# Patient Record
Sex: Male | Born: 1955 | Race: White | Hispanic: No | Marital: Married | Smoking: Current some day smoker
Health system: Southern US, Community
[De-identification: ages and names within clinical notes are randomized; demographics above are authoritative.]

## PROBLEM LIST (undated history)

## (undated) DIAGNOSIS — I10 Essential (primary) hypertension: Secondary | ICD-10-CM

---

## 2011-03-30 ENCOUNTER — Ambulatory Visit: Payer: Self-pay | Admitting: Gastroenterology

## 2011-04-17 ENCOUNTER — Ambulatory Visit: Payer: Self-pay | Admitting: Gastroenterology

## 2012-03-09 IMAGING — US ULTRASOUND CORE BIOPSY
1 series · 5 of 5 positions shown · non-contrast
Comparison: none

REASON FOR EXAM: hep C
COMMENTS:

PROCEDURE:     US  - US GUIDED BX/ASPIRATION NOT BR  - April 17, 2011  [DATE]
RESULT:
HISTORY: Hepatitis C.

[Series 1: ultrasound core biopsy · 5 of 5 slices shown]
[im 1/5]
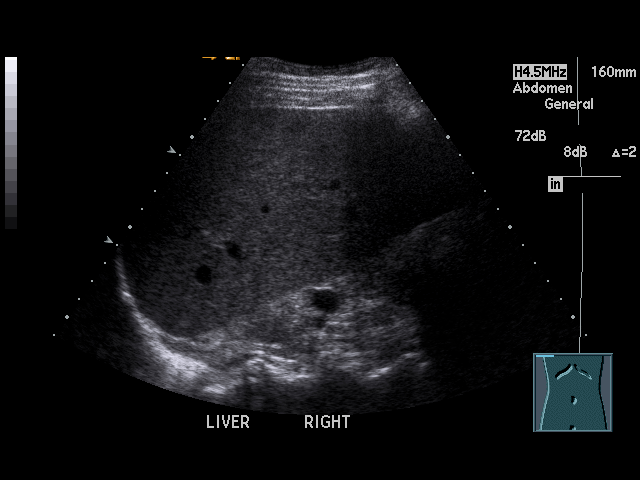
[im 2/5]
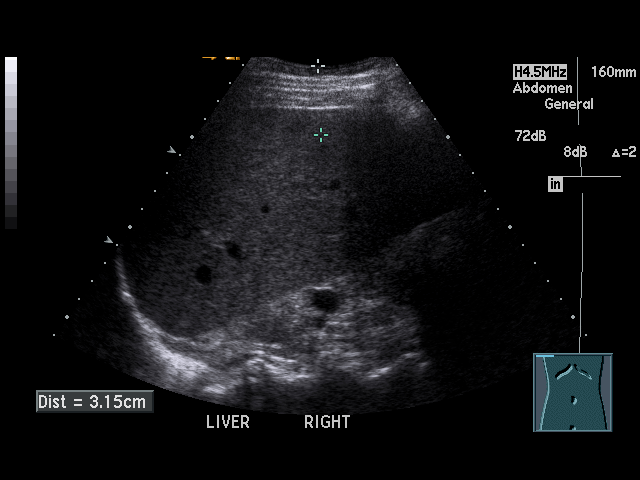
[im 3/5]
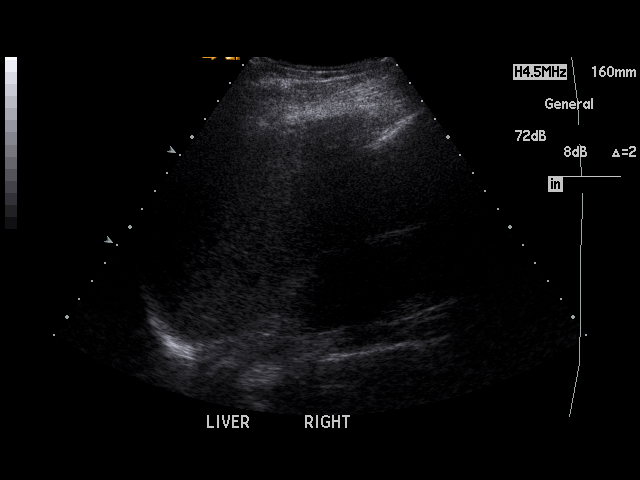
[im 4/5]
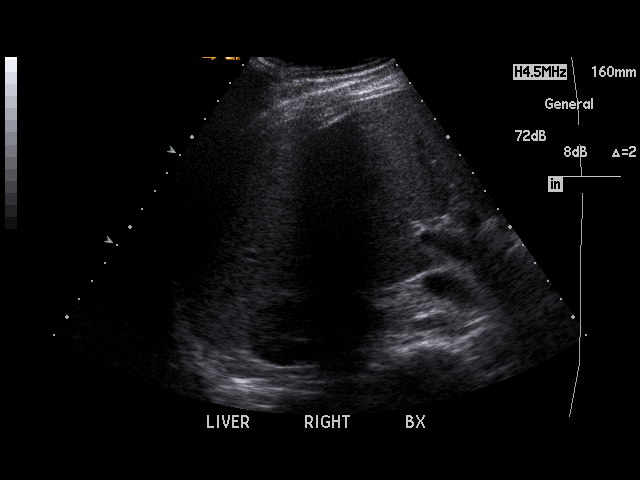
[im 5/5]
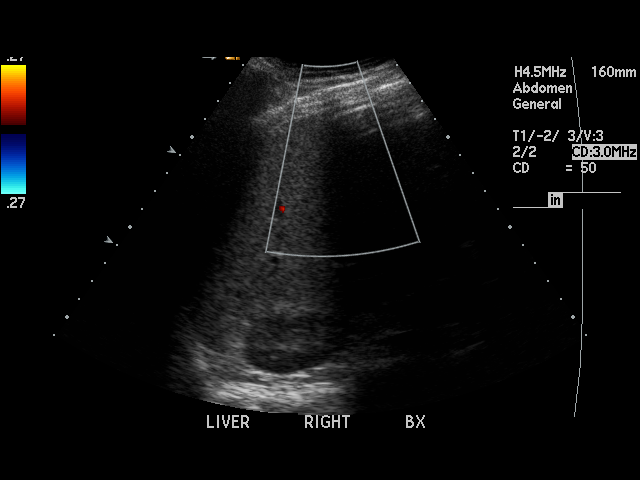

[5 of 5 positions shown; findings below may reference images not displayed]

FINDINGS: After discussing the risks and benefits of this procedure, patient
informed consent was obtained. The abdomen was sterilely prepped and draped.
Following local anesthesia with 1% Lidocaine, IV conscious sedation was
performed and an 18 gauge Achieve needle system was advanced into the liver
and a core sample obtained. There were no complications.
IMPRESSION: Successful ultrasound directed liver biopsy.

## 2014-05-05 ENCOUNTER — Emergency Department: Payer: Self-pay | Admitting: Emergency Medicine

## 2015-03-28 IMAGING — CR LEFT WRIST - COMPLETE 3+ VIEW
1 series · 4 of 4 positions shown · non-contrast
Comparison: None.

CLINICAL DATA: Radial wrist pain status post fall.

EXAM:
LEFT WRIST - COMPLETE 3+ VIEW

[Series 1: pa · 0.17mm/px · 4 of 4 slices shown]
[im 1/4]
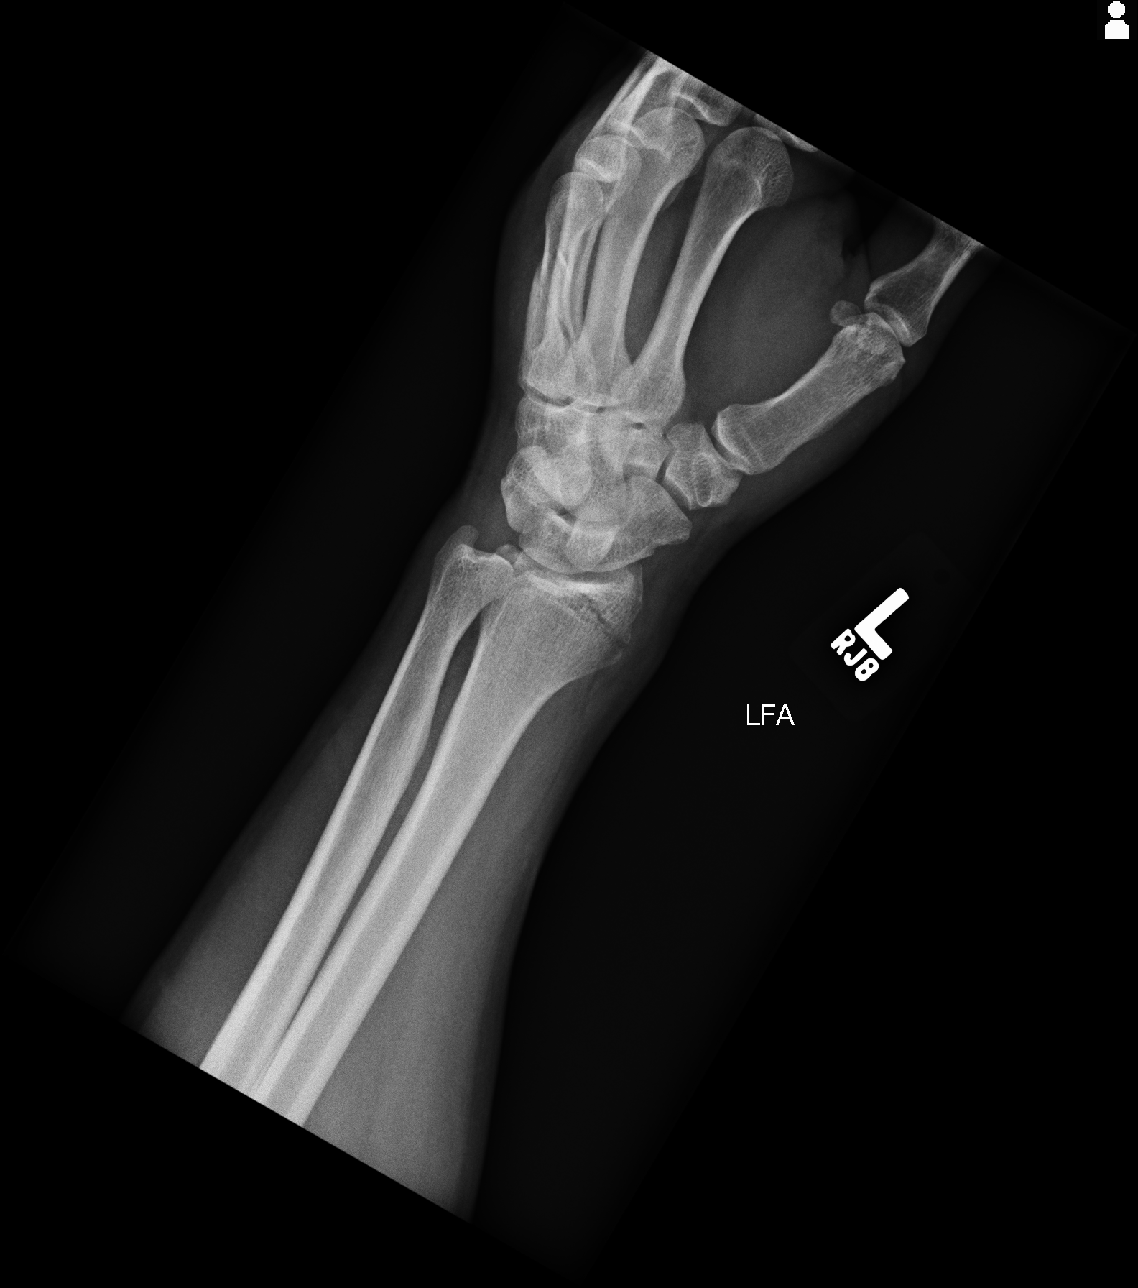
[im 2/4]
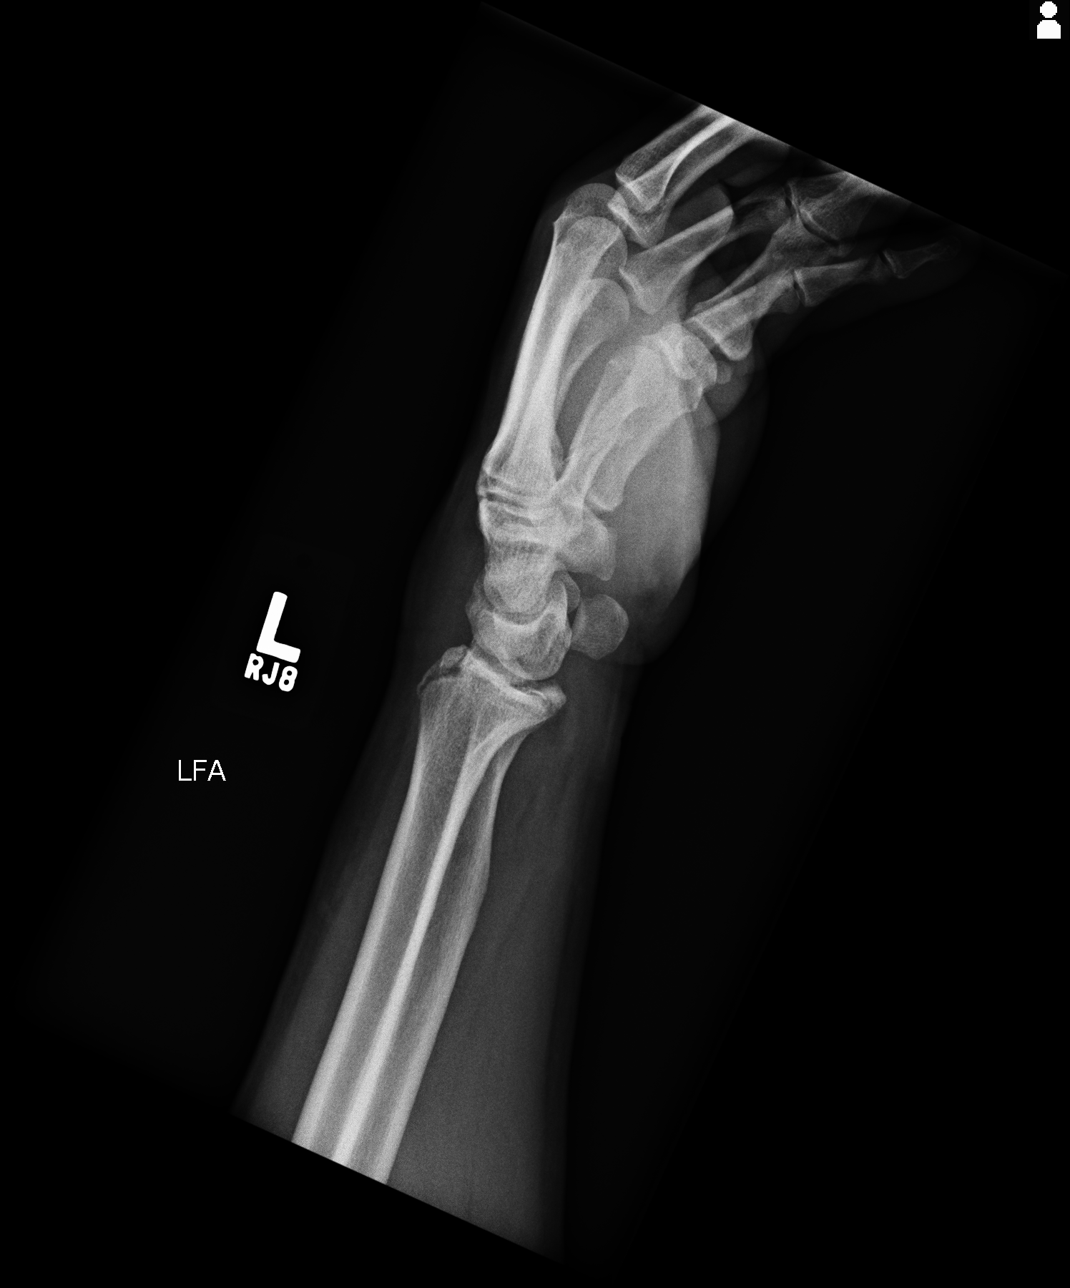
[im 3/4]
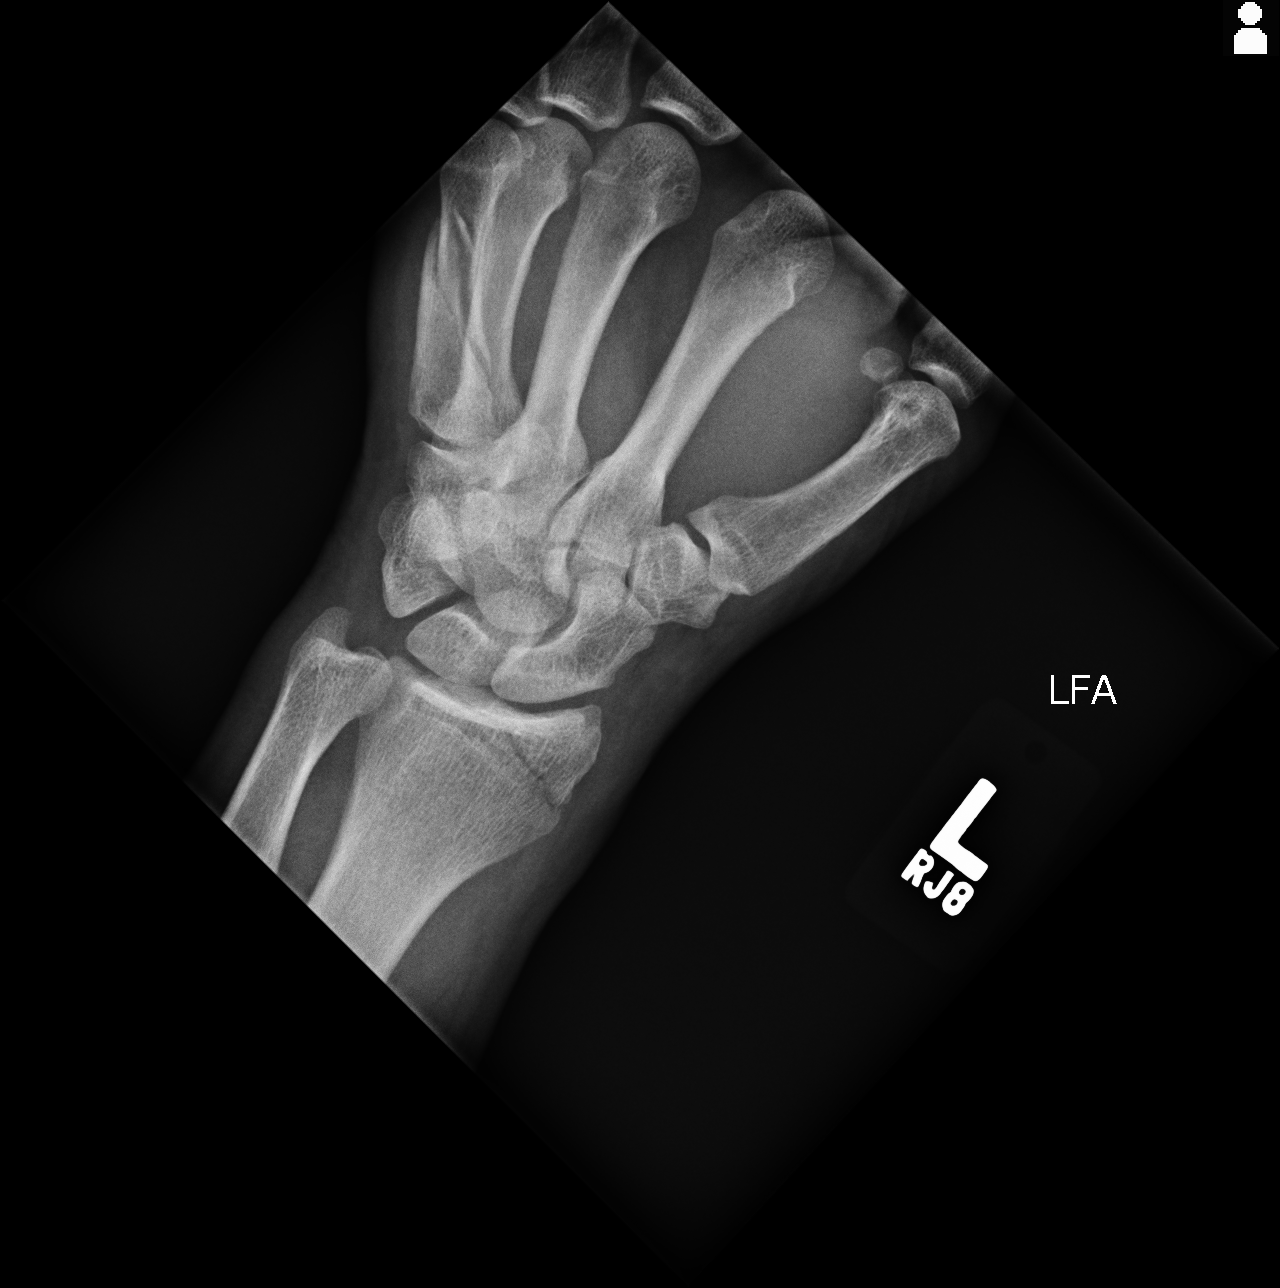
[im 4/4]
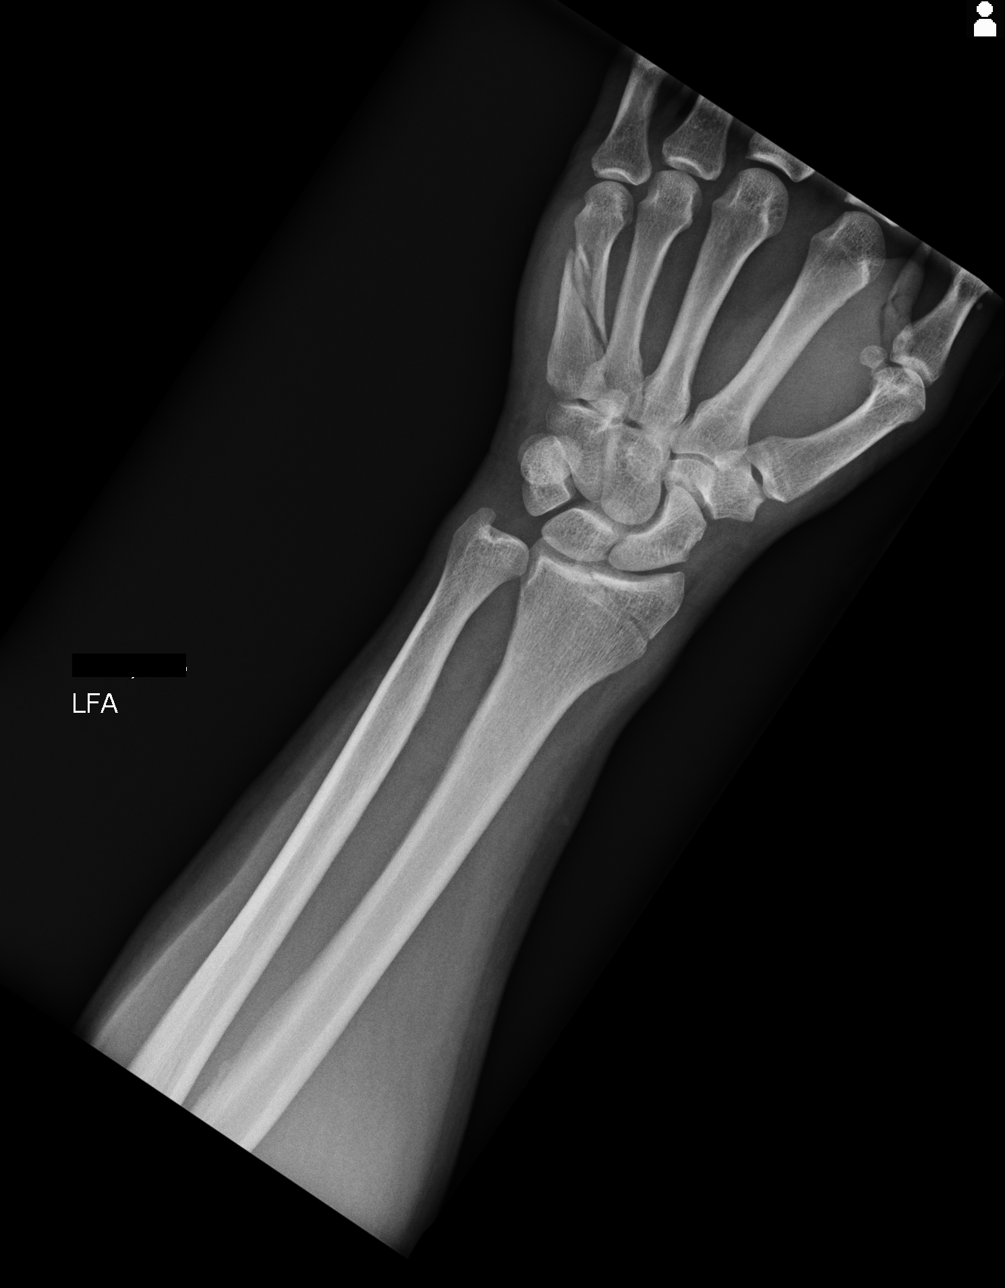

[4 of 4 positions shown; findings below may reference images not displayed]

FINDINGS: There is a nondisplaced intra-articular fracture involving the
radial styloid. This extends to the distal articular surface. The
distal ulna appears intact. No carpal bone fractures are identified.
However, there is an oblique mildly displaced fracture of the fifth
metatarsal diaphysis.
IMPRESSION: 1. Nondisplaced intra-articular fracture of the radial styloid.
2. Mildly displaced oblique fracture of the fifth metacarpal
diaphysis.

## 2023-06-02 ENCOUNTER — Other Ambulatory Visit: Payer: Self-pay

## 2023-06-02 ENCOUNTER — Emergency Department: Payer: Medicare PPO

## 2023-06-02 ENCOUNTER — Emergency Department
Admission: EM | Admit: 2023-06-02 | Discharge: 2023-06-02 | Disposition: A | Discharge status: Home / Self Care | Payer: Medicare PPO | Attending: Emergency Medicine | Admitting: Emergency Medicine

## 2023-06-02 DIAGNOSIS — I10 Essential (primary) hypertension: Secondary | ICD-10-CM | POA: Diagnosis not present

## 2023-06-02 DIAGNOSIS — M5430 Sciatica, unspecified side: Secondary | ICD-10-CM | POA: Diagnosis not present

## 2023-06-02 DIAGNOSIS — M545 Low back pain, unspecified: Secondary | ICD-10-CM | POA: Diagnosis present

## 2023-06-02 HISTORY — DX: Essential (primary) hypertension: I10

## 2023-06-02 MED ORDER — KETOROLAC TROMETHAMINE 30 MG/ML IJ SOLN
30.0000 mg | Freq: Once | INTRAMUSCULAR | Status: AC
  Administered 2023-06-02: 30 mg via INTRAMUSCULAR
  Filled 2023-06-02: qty 1

## 2023-06-02 MED ORDER — METHYLPREDNISOLONE 4 MG PO TBPK: ORAL_TABLET | ORAL | 0 refills | Status: AC

## 2023-06-02 MED ORDER — HYDROCODONE-ACETAMINOPHEN 5-325 MG PO TABS: 1.0000 | ORAL_TABLET | Freq: Four times a day (QID) | ORAL | 0 refills | Status: AC | PRN

## 2023-06-02 MED ORDER — BACLOFEN 10 MG PO TABS: 10.0000 mg | ORAL_TABLET | Freq: Three times a day (TID) | ORAL | 0 refills | Status: AC

## 2023-06-02 MED ORDER — CYCLOBENZAPRINE HCL 10 MG PO TABS
10.0000 mg | ORAL_TABLET | Freq: Once | ORAL | Status: AC
  Administered 2023-06-02: 10 mg via ORAL
  Filled 2023-06-02: qty 1

## 2023-06-02 NOTE — Discharge Instructions (Signed)
Use the medication as prescribed Hydrocodone should be reserved for night time use as needed Stop the baclofen 24-48 hours prior to your colonoscopy Use wet heat and ice

## 2023-06-02 NOTE — ED Provider Notes (Signed)
[facility] Provider Note    Event Date/Time   First MD Initiated Contact with Patient 06/02/23 0720     (approximate)   History   Back Pain   HPI  Manuel Krueger is a 67 y.o. male with history of hypertension presents emergency department with back pain that radiates down the right leg.  Patient states that he mowed the yard 3 to 4 days ago.  Was okay until last night and then started having pain in the lower back and radiates down the leg.  States he got, hot bath which did help.  Has been taking ibuprofen.  Patient states that the pain comes and goes.  Since last night it has been steady.  No medications or heat have helped.  No loss of bowel or bladder control.  No abdominal pain.      Physical Exam   Triage Vital Signs: ED Triage Vitals  Enc Vitals Group     BP 06/02/23 0636 (!) 141/89     Pulse Rate 06/02/23 0636 63     Resp 06/02/23 0636 18     Temp 06/02/23 0635 98.2 F (36.8 C)     Temp src --      SpO2 06/02/23 0636 99 %     Weight 06/02/23 0635 195 lb (88.5 kg)     Height 06/02/23 0635 6' (1.829 m)     Head Circumference --      Peak Flow --      Pain Score 06/02/23 0635 5     Pain Loc --      Pain Edu? --      Excl. in GC? --     Most recent vital signs: Vitals:   06/02/23 0635 06/02/23 0636  BP:  (!) 141/89  Pulse:  63  Resp:  18  Temp: 98.2 F (36.8 C)   SpO2:  99%     General: Awake, no distress.   CV:  Good peripheral perfusion. regular rate and  rhythm Resp:  Normal effort.  Abd:  No distention.   Other:  Tender at the SI joint, patient is standing as he is uncomfortable with sitting or lying down, neurovascular appears to be intact, able to ambulate without difficulty   ED Results / Procedures / Treatments   Labs (all labs ordered are listed, but only abnormal results are displayed) Labs Reviewed - No data to display   EKG     RADIOLOGY X-ray lumbar  spine    PROCEDURES:   Procedures   MEDICATIONS ORDERED IN ED: Medications  ketorolac (TORADOL) 30 MG/ML injection 30 mg (30 mg Intramuscular Given 06/02/23 0741)  cyclobenzaprine (FLEXERIL) tablet 10 mg (10 mg Oral Given 06/02/23 0742)     IMPRESSION / MDM / ASSESSMENT AND PLAN / ED COURSE  I reviewed the triage vital signs and the nursing notes.                              Differential diagnosis includes, but is not limited to, lumbar strain, sciatica, radiculopathy, occult fracture, metastatic disease  Patient's presentation is most consistent with acute complicated illness / injury requiring diagnostic workup.   Patient was given Toradol 30 mg IM, Flexeril 10 mg p.o., and x-ray was ordered of the lumbar spine   X-rays of lumbar spine was independently reviewed and interpreted by me as being negative for any acute abnormality  I did explain the findings  to the patient.  Explained to him versus degenerative changes in the lumbar spine mostly increased at L5-S1 which indicate the type of pain that he is having.  He is to use a Medrol Dosepak, baclofen, and hydrocodone at night only.  Patient is to follow-up with his regular doctor if not improving 3 to 4 days.  He has colonoscopy scheduled for next Friday, I explained to him he may stop the baclofen at least 24 to 48 hours prior to his procedure.  Patient is in agreement with treatment plan.  He was discharged stable condition.   FINAL CLINICAL IMPRESSION(S) / ED DIAGNOSES   Final diagnoses:  Acute sciatica     Rx / DC Orders   ED Discharge Orders          Ordered    methylPREDNISolone (MEDROL DOSEPAK) 4 MG TBPK tablet        06/02/23 0812    baclofen (LIORESAL) 10 MG tablet  3 times daily        06/02/23 0812    HYDROcodone-acetaminophen (NORCO/VICODIN) 5-325 MG tablet  Every 6 hours PRN        06/02/23 4696             Note:  This document was prepared using Dragon voice recognition software and may  include unintentional dictation errors.    Faythe Ghee, PA-C 06/02/23 2952    Minna Antis, MD 06/02/23 1421

## 2023-06-02 NOTE — ED Triage Notes (Signed)
Pt to ED for lower back pain radiating down right leg x3 days. Denies recent injuries  Ambulatory, NAD noted

## 2023-11-06 ENCOUNTER — Other Ambulatory Visit: Payer: Self-pay | Admitting: Internal Medicine

## 2023-11-06 DIAGNOSIS — I10 Essential (primary) hypertension: Secondary | ICD-10-CM

## 2023-11-06 DIAGNOSIS — E7800 Pure hypercholesterolemia, unspecified: Secondary | ICD-10-CM

## 2023-11-26 ENCOUNTER — Ambulatory Visit
Admission: RE | Admit: 2023-11-26 | Discharge: 2023-11-26 | Disposition: A | Discharge status: Home / Self Care | Payer: Self-pay | Source: Ambulatory Visit | Attending: Internal Medicine | Admitting: Internal Medicine

## 2023-11-26 DIAGNOSIS — E7800 Pure hypercholesterolemia, unspecified: Secondary | ICD-10-CM | POA: Insufficient documentation

## 2023-11-26 DIAGNOSIS — I10 Essential (primary) hypertension: Secondary | ICD-10-CM | POA: Insufficient documentation
